# Patient Record
Sex: Male | Born: 1995 | Race: Black or African American | Hispanic: No | Marital: Single | Smoking: Never smoker
Health system: Southern US, Community
[De-identification: ages and names within clinical notes are randomized; demographics above are authoritative.]

## PROBLEM LIST (undated history)

## (undated) DIAGNOSIS — R569 Unspecified convulsions: Secondary | ICD-10-CM

---
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
## 2016-05-08 ENCOUNTER — Emergency Department (HOSPITAL_COMMUNITY)
Admission: EM | Admit: 2016-05-08 | Discharge: 2016-05-08 | Disposition: A | Discharge status: Home / Self Care | Payer: Medicaid Other | Attending: Emergency Medicine | Admitting: Emergency Medicine

## 2016-05-08 ENCOUNTER — Encounter (HOSPITAL_COMMUNITY): Payer: Self-pay | Admitting: Emergency Medicine

## 2016-05-08 DIAGNOSIS — R569 Unspecified convulsions: Secondary | ICD-10-CM | POA: Diagnosis present

## 2016-05-08 DIAGNOSIS — G40909 Epilepsy, unspecified, not intractable, without status epilepticus: Secondary | ICD-10-CM | POA: Diagnosis not present

## 2016-05-08 HISTORY — DX: Unspecified convulsions: R56.9

## 2016-05-08 LAB — CBC WITH DIFFERENTIAL/PLATELET
BASOS ABS: 0 10*3/uL (ref 0.0–0.1)
BASOS PCT: 0 %
EOS ABS: 0.2 10*3/uL (ref 0.0–0.7)
Eosinophils Relative: 1 %
HCT: 42.1 % (ref 39.0–52.0)
HEMOGLOBIN: 15.1 g/dL (ref 13.0–17.0)
Lymphocytes Relative: 11 %
Lymphs Abs: 1.9 10*3/uL (ref 0.7–4.0)
MCH: 33.1 pg (ref 26.0–34.0)
MCHC: 35.9 g/dL (ref 30.0–36.0)
MCV: 92.3 fL (ref 78.0–100.0)
MONOS PCT: 9 %
Monocytes Absolute: 1.5 10*3/uL — ABNORMAL HIGH (ref 0.1–1.0)
NEUTROS PCT: 79 %
Neutro Abs: 13 10*3/uL — ABNORMAL HIGH (ref 1.7–7.7)
Platelets: 249 10*3/uL (ref 150–400)
RBC: 4.56 MIL/uL (ref 4.22–5.81)
RDW: 13.2 % (ref 11.5–15.5)
WBC: 16.6 10*3/uL — AB (ref 4.0–10.5)

## 2016-05-08 LAB — COMPREHENSIVE METABOLIC PANEL
ALBUMIN: 4.8 g/dL (ref 3.5–5.0)
ALK PHOS: 69 U/L (ref 38–126)
ALT: 52 U/L (ref 17–63)
ANION GAP: 7 (ref 5–15)
AST: 36 U/L (ref 15–41)
BUN: 21 mg/dL — ABNORMAL HIGH (ref 6–20)
CALCIUM: 9.4 mg/dL (ref 8.9–10.3)
CO2: 25 mmol/L (ref 22–32)
Chloride: 108 mmol/L (ref 101–111)
Creatinine, Ser: 1.12 mg/dL (ref 0.61–1.24)
GFR calc Af Amer: >60 mL/min (ref >60)
GFR calc non Af Amer: >60 mL/min (ref >60)
GLUCOSE: 99 mg/dL (ref 65–99)
POTASSIUM: 4.3 mmol/L (ref 3.5–5.1)
SODIUM: 140 mmol/L (ref 135–145)
Total Bilirubin: 0.6 mg/dL (ref 0.3–1.2)
Total Protein: 7.9 g/dL (ref 6.5–8.1)

## 2016-05-08 LAB — CBG MONITORING, ED: GLUCOSE-CAPILLARY: 116 mg/dL — AB (ref 65–99)

## 2016-05-08 MED ORDER — LEVETIRACETAM 100 MG/ML PO SOLN: 500.0000 mg | Freq: Two times a day (BID) | ORAL | 1 refills | Status: AC

## 2016-05-08 MED ORDER — SODIUM CHLORIDE 0.9 % IV SOLN
1000.0000 mg | Freq: Two times a day (BID) | INTRAVENOUS | Status: DC
  Administered 2016-05-08: 1000 mg via INTRAVENOUS
  Filled 2016-05-08: qty 10

## 2016-05-08 MED ORDER — DIAZEPAM 10 MG RE GEL: 10.0000 mg | Freq: Once | RECTAL | 0 refills | Status: AC

## 2016-05-08 NOTE — ED Provider Notes (Signed)
[facility]-EMERGENCY DEPT Provider Note   CSN: [id number] Arrival date & time: 05/08/16  1959  By signing my name below, I, Rosario Adie, attest that this documentation has been prepared under the direction and in the presence of [employer], PA-C.  Electronically Signed: Rosario Adie, ED Scribe. 05/08/16. 8:38 PM.  History   Chief Complaint Chief Complaint  Patient presents with  . Seizures   The history is provided by a parent and the EMS personnel. No language interpreter was used.   HPI Comments: Frank Horton is a 20 y.o. male BIB EMS, who presents to the Emergency Department s/p sudden onset, witnessed, seizure-like activity that occurred just PTA. Seizure-like episode characterized by full body shaking. Per mother, this is the pt's second seizure. She states that this seizure lasted for approximately 5 minutes. Father reports noting a small amount of blood and foam was coming from his mouth. Per father, the pt became more alert a few minutes after seizure-like activity stopped. His first seizure occurred approximately 2 months ago. Patient was evaluated in an ED following the episode. Work up included head CT which was unremarkable, per mother. Pt has an appointment with a Neurologist in 2 weeks for evaluation of this new problem. No FHx of seizures and no previously dx'd seizure disorder. Per mother the pt has a hx of autism and is nonverbal at baseline. Pt is not on rx'd medications daily. Mother reports that the pt has a lot of sinus issues at baseline, and she gave him a dose of sinus medication today. No other recent illnesses. Parents deny emesis or fever.    PCP: Real Cons, MD Neurology: [facility]   Past Medical History:  Diagnosis Date  . Seizures (HCC)    There are no active problems to display for this patient.  History reviewed. No pertinent surgical history.  Home Medications    Prior to Admission medications   Medication Sig Start Date End Date  Taking? Authorizing Provider  diazepam (DIASTAT ACUDIAL) 10 MG GEL Place 10 mg rectally once. Use as needed for seizure episode 05/08/16 05/08/16  Antony Madura, PA-C  levETIRAcetam (KEPPRA) 100 MG/ML solution Take 5 mLs (500 mg total) by mouth every 12 (twelve) hours. 05/08/16   Antony Madura, PA-C   Family History No family history on file.  Social History Social History  Substance Use Topics  . Smoking status: Never Smoker  . Smokeless tobacco: Never Used  . Alcohol use No   Allergies   Review of patient's allergies indicates not on file.  Review of Systems Review of Systems  Gastrointestinal: Negative for vomiting.  Neurological: Positive for seizures.  All other systems reviewed and are negative. Physical Exam  Updated Vital Signs BP 100/56   Pulse 88   Temp 98.6 F (37 C) (Oral)   Resp 15   Ht 6\' 1"  (1.854 m)   Wt 99.8 kg   SpO2 94%   BMI 29.03 kg/m   Physical Exam  Constitutional: He appears well-developed and well-nourished. No distress.  HENT:  Head: Normocephalic and atraumatic.  Mouth/Throat: No oropharyngeal exudate.  Evidence of trauma to left lateral tongue. Oropharynx clear. Symmetric rise of the uvula with phonation. Tongue midline.  Eyes: Conjunctivae and EOM are normal. Pupils are equal, round, and reactive to light. No scleral icterus.  EOMs normal. No nystagmus noted.  Neck: Normal range of motion.  No nuchal rigidity or meningismus.  Cardiovascular: Normal rate, regular rhythm and intact distal pulses.   Pulmonary/Chest: Effort  normal. No respiratory distress. He has no wheezes. He has no rales.  Lungs CTAB. Respirations even and unlabored  Musculoskeletal: Normal range of motion.  Neurological: He is alert.  Alert to verbal and physical stimuli. Speech is goal oriented. No focal neurologic deficits noted. Patient moving all extremities.  Skin: Skin is warm and dry. No rash noted. He is not diaphoretic. No erythema. No pallor.  Psychiatric: He has a  normal mood and affect. His behavior is normal.  Nursing note and vitals reviewed.   ED Treatments / Results  DIAGNOSTIC STUDIES: Oxygen Saturation is 91% on RA, low by my interpretation.   COORDINATION OF CARE: 8:35 PM-Discussed next steps with pt. Pt verbalized understanding and is agreeable with the plan.   Labs (all labs ordered are listed, but only abnormal results are displayed) Labs Reviewed  CBC WITH DIFFERENTIAL/PLATELET - Abnormal; Notable for the following:       Result Value   WBC 16.6 (*)    Neutro Abs 13.0 (*)    Monocytes Absolute 1.5 (*)    All other components within normal limits  COMPREHENSIVE METABOLIC PANEL - Abnormal; Notable for the following:    BUN 21 (*)    All other components within normal limits  CBG MONITORING, ED - Abnormal; Notable for the following:    Glucose-Capillary 116 (*)    All other components within normal limits   EKG  EKG Interpretation  Date/Time:  Saturday May 08 2016 20:22:17 EDT Ventricular Rate:  93 PR Interval:    QRS Duration: 101 QT Interval:  358 QTC Calculation: 446 R Axis:   58 Text Interpretation:  Sinus rhythm No prior EKG.  Confirmed by LIU MD, Annabelle HarmanANA [phone]([id number]) on 05/08/2016 8:33:26 PM      Radiology No results found.  Procedures Procedures (including critical care time)  Medications Ordered in ED Medications  levETIRAcetam (KEPPRA) 1,000 mg in sodium chloride 0.9 % 100 mL IVPB (0 mg Intravenous Stopped 05/08/16 2310)    Initial Impression / Assessment and Plan / ED Course  I have reviewed the triage vital signs and the nursing notes.  Pertinent labs & imaging results that were available during my care of the patient were reviewed by me and considered in my medical decision making (see chart for details).  Clinical Course    10:10 PM Electrolytes reviewed. No abnormalities. Leukocytosis likely secondary to seizure like episode. Case discussed with Dr. Roseanne RenoStewart of neurology who recommends 1000mg  IV  loading dose of Keppra with prescribed dose of 500mg  BID starting tomorrow. Meds ordered.  11:30 PM Patient has completed his Keppra. No further seizure episodes. Plan for discharge on Keppra BID. Patient ambulated out of the department in stable condition. Family with no unaddressed concerns.   Final Clinical Impressions(s) / ED Diagnoses   Final diagnoses:  Seizure-like activity [facility](HCC)   New Prescriptions Discharge Medication List as of 05/08/2016 11:44 PM    START taking these medications   Details  diazepam (DIASTAT ACUDIAL) 10 MG GEL Place 10 mg rectally once. Use as needed for seizure episode, Starting Sat 05/08/2016, Print    levETIRAcetam (KEPPRA) 100 MG/ML solution Take 5 mLs (500 mg total) by mouth every 12 (twelve) hours., Starting Sat 05/08/2016, Print        I personally performed the services described in this documentation, which was scribed in my presence. The recorded information has been reviewed and is accurate.       Antony MaduraKelly Else Habermann, PA-C 05/09/16 0010    Neysa Bonitoana Duo  Verdie Mosher, MD 05/09/16 1610

## 2016-05-08 NOTE — ED Triage Notes (Addendum)
Patient here with c/o of witnessed seizure today by family. Family states that this is the second one he has had and has appointment with neurology. Patient is nonverbal per norm.

## 2016-05-08 NOTE — Discharge Instructions (Signed)
Take Keppra as prescribed for seizure management. Follow-up with your neurologist at your appointment. Use rectal Diastat only for management of a seizure episode. You may return for any new or concerning symptoms.

## 2018-07-30 DEATH — deceased
